# Patient Record
Sex: Female | Born: 1975 | Race: Black or African American | Hispanic: No | Marital: Married | Smoking: Never smoker
Health system: Southern US, Community
[De-identification: ages and names within clinical notes are randomized; demographics above are authoritative.]

---

## 2001-12-24 ENCOUNTER — Other Ambulatory Visit: Admission: RE | Admit: 2001-12-24 | Discharge: 2001-12-24 | Payer: Self-pay | Admitting: Gynecology

## 2002-12-25 ENCOUNTER — Ambulatory Visit (HOSPITAL_COMMUNITY): Admission: RE | Admit: 2002-12-25 | Discharge: 2002-12-25 | Payer: Self-pay | Admitting: Internal Medicine

## 2002-12-25 ENCOUNTER — Encounter: Payer: Self-pay | Admitting: Internal Medicine

## 2003-01-09 ENCOUNTER — Encounter: Admission: RE | Admit: 2003-01-09 | Discharge: 2003-02-04 | Payer: Self-pay | Admitting: Internal Medicine

## 2003-01-14 ENCOUNTER — Other Ambulatory Visit: Admission: RE | Admit: 2003-01-14 | Discharge: 2003-01-14 | Payer: Self-pay | Admitting: Gynecology

## 2003-01-30 ENCOUNTER — Encounter: Payer: Self-pay | Admitting: Internal Medicine

## 2004-01-27 ENCOUNTER — Other Ambulatory Visit: Admission: RE | Admit: 2004-01-27 | Discharge: 2004-01-27 | Payer: Self-pay | Admitting: Obstetrics & Gynecology

## 2004-06-09 ENCOUNTER — Emergency Department (HOSPITAL_COMMUNITY): Admission: EM | Admit: 2004-06-09 | Discharge: 2004-06-09 | Payer: Self-pay | Admitting: Emergency Medicine

## 2004-06-12 ENCOUNTER — Emergency Department (HOSPITAL_COMMUNITY): Admission: EM | Admit: 2004-06-12 | Discharge: 2004-06-12 | Payer: Self-pay | Admitting: Emergency Medicine

## 2005-02-02 ENCOUNTER — Emergency Department: Payer: Self-pay | Admitting: Unknown Physician Specialty

## 2005-02-07 ENCOUNTER — Ambulatory Visit: Payer: Self-pay | Admitting: Internal Medicine

## 2005-06-16 ENCOUNTER — Other Ambulatory Visit: Admission: RE | Admit: 2005-06-16 | Discharge: 2005-06-16 | Payer: Self-pay | Admitting: Obstetrics and Gynecology

## 2005-10-06 ENCOUNTER — Ambulatory Visit: Payer: Self-pay | Admitting: Internal Medicine

## 2007-08-16 ENCOUNTER — Ambulatory Visit: Payer: Self-pay | Admitting: Internal Medicine

## 2007-08-16 LAB — CONVERTED CEMR LAB
ALT: 13 units/L (ref 0–35)
AST: 24 units/L (ref 0–37)
Albumin: 4.2 g/dL (ref 3.5–5.2)
Alkaline Phosphatase: 50 units/L (ref 39–117)
BUN: 8 mg/dL (ref 6–23)
Basophils Absolute: 0 10*3/uL (ref 0.0–0.1)
Basophils Relative: 0.4 % (ref 0.0–1.0)
Bilirubin Urine: NEGATIVE
Bilirubin, Direct: 0.1 mg/dL (ref 0.0–0.3)
CO2: 27 meq/L (ref 19–32)
Calcium: 9.5 mg/dL (ref 8.4–10.5)
Chloride: 106 meq/L (ref 96–112)
Creatinine, Ser: 0.9 mg/dL (ref 0.4–1.2)
Crystals: NEGATIVE
Eosinophils Absolute: 0.1 10*3/uL (ref 0.0–0.6)
Eosinophils Relative: 1.4 % (ref 0.0–5.0)
GFR calc Af Amer: 94 mL/min
GFR calc non Af Amer: 78 mL/min
Glucose, Bld: 106 mg/dL — ABNORMAL HIGH (ref 70–99)
HCT: 37.2 % (ref 36.0–46.0)
Hemoglobin, Urine: NEGATIVE
Hemoglobin: 12.8 g/dL (ref 12.0–15.0)
Ketones, ur: NEGATIVE mg/dL
Lymphocytes Relative: 32.9 % (ref 12.0–46.0)
MCHC: 34.5 g/dL (ref 30.0–36.0)
MCV: 92.3 fL (ref 78.0–100.0)
Monocytes Absolute: 0.8 10*3/uL — ABNORMAL HIGH (ref 0.2–0.7)
Monocytes Relative: 8.9 % (ref 3.0–11.0)
Mucus, UA: NEGATIVE
Neutro Abs: 5.1 10*3/uL (ref 1.4–7.7)
Neutrophils Relative %: 56.4 % (ref 43.0–77.0)
Nitrite: NEGATIVE
Platelets: 192 10*3/uL (ref 150–400)
Potassium: 3.7 meq/L (ref 3.5–5.1)
RBC: 4.03 M/uL (ref 3.87–5.11)
RDW: 12.4 % (ref 11.5–14.6)
Sodium: 138 meq/L (ref 135–145)
Specific Gravity, Urine: 1.025 (ref 1.000–1.03)
TSH: 1.11 microintl units/mL (ref 0.35–5.50)
Total Bilirubin: 1 mg/dL (ref 0.3–1.2)
Total Protein, Urine: NEGATIVE mg/dL
Total Protein: 6.9 g/dL (ref 6.0–8.3)
Urine Glucose: NEGATIVE mg/dL
Urobilinogen, UA: 0.2 (ref 0.0–1.0)
WBC: 8.9 10*3/uL (ref 4.5–10.5)
pH: 6 (ref 5.0–8.0)

## 2007-10-03 ENCOUNTER — Encounter: Payer: Self-pay | Admitting: Internal Medicine

## 2007-10-03 ENCOUNTER — Ambulatory Visit: Payer: Self-pay | Admitting: Internal Medicine

## 2007-10-03 DIAGNOSIS — J309 Allergic rhinitis, unspecified: Secondary | ICD-10-CM | POA: Insufficient documentation

## 2007-10-03 DIAGNOSIS — M12849 Other specific arthropathies, not elsewhere classified, unspecified hand: Secondary | ICD-10-CM

## 2007-10-03 DIAGNOSIS — K219 Gastro-esophageal reflux disease without esophagitis: Secondary | ICD-10-CM

## 2008-05-07 ENCOUNTER — Ambulatory Visit: Payer: Self-pay | Admitting: Endocrinology

## 2008-05-07 ENCOUNTER — Encounter: Payer: Self-pay | Admitting: Endocrinology

## 2008-05-07 DIAGNOSIS — M25569 Pain in unspecified knee: Secondary | ICD-10-CM

## 2008-05-08 LAB — CONVERTED CEMR LAB: Uric Acid, Serum: 3.2 mg/dL (ref 2.4–7.0)

## 2008-06-16 ENCOUNTER — Ambulatory Visit: Payer: Self-pay | Admitting: Internal Medicine

## 2008-06-16 ENCOUNTER — Telehealth: Payer: Self-pay | Admitting: Internal Medicine

## 2008-06-16 LAB — CONVERTED CEMR LAB: hCG, Beta Chain, Quant, S: <0.5 milliintl units/mL

## 2008-06-17 ENCOUNTER — Telehealth: Payer: Self-pay | Admitting: Internal Medicine

## 2008-07-07 ENCOUNTER — Telehealth: Payer: Self-pay | Admitting: Internal Medicine

## 2008-10-27 IMAGING — CR DG KNEE 1-2V*R*
2 series · 2 of 2 positions shown · non-contrast
Comparison: None

CLINICAL DATA: Anterior knee pain for a month, no injury

RIGHT KNEE - 1-2 VIEW

[view not recorded (1 of 2)]
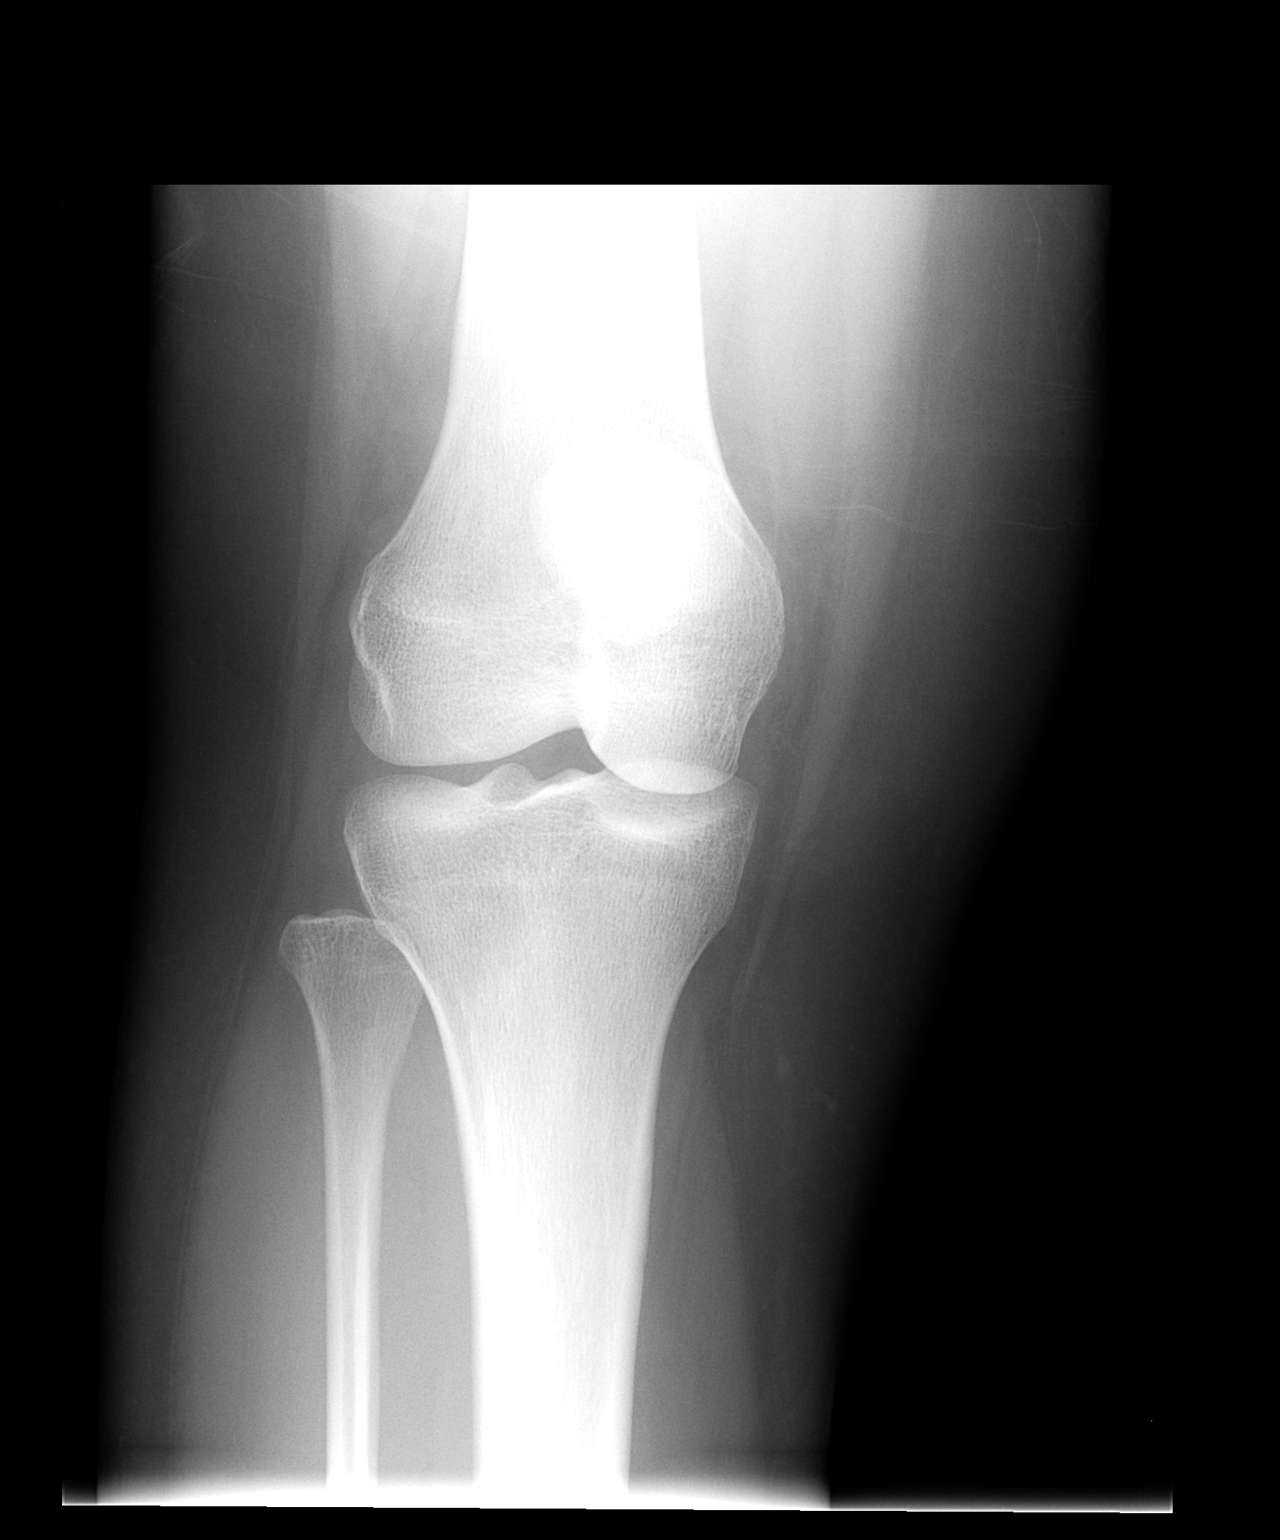

[view not recorded (2 of 2)]
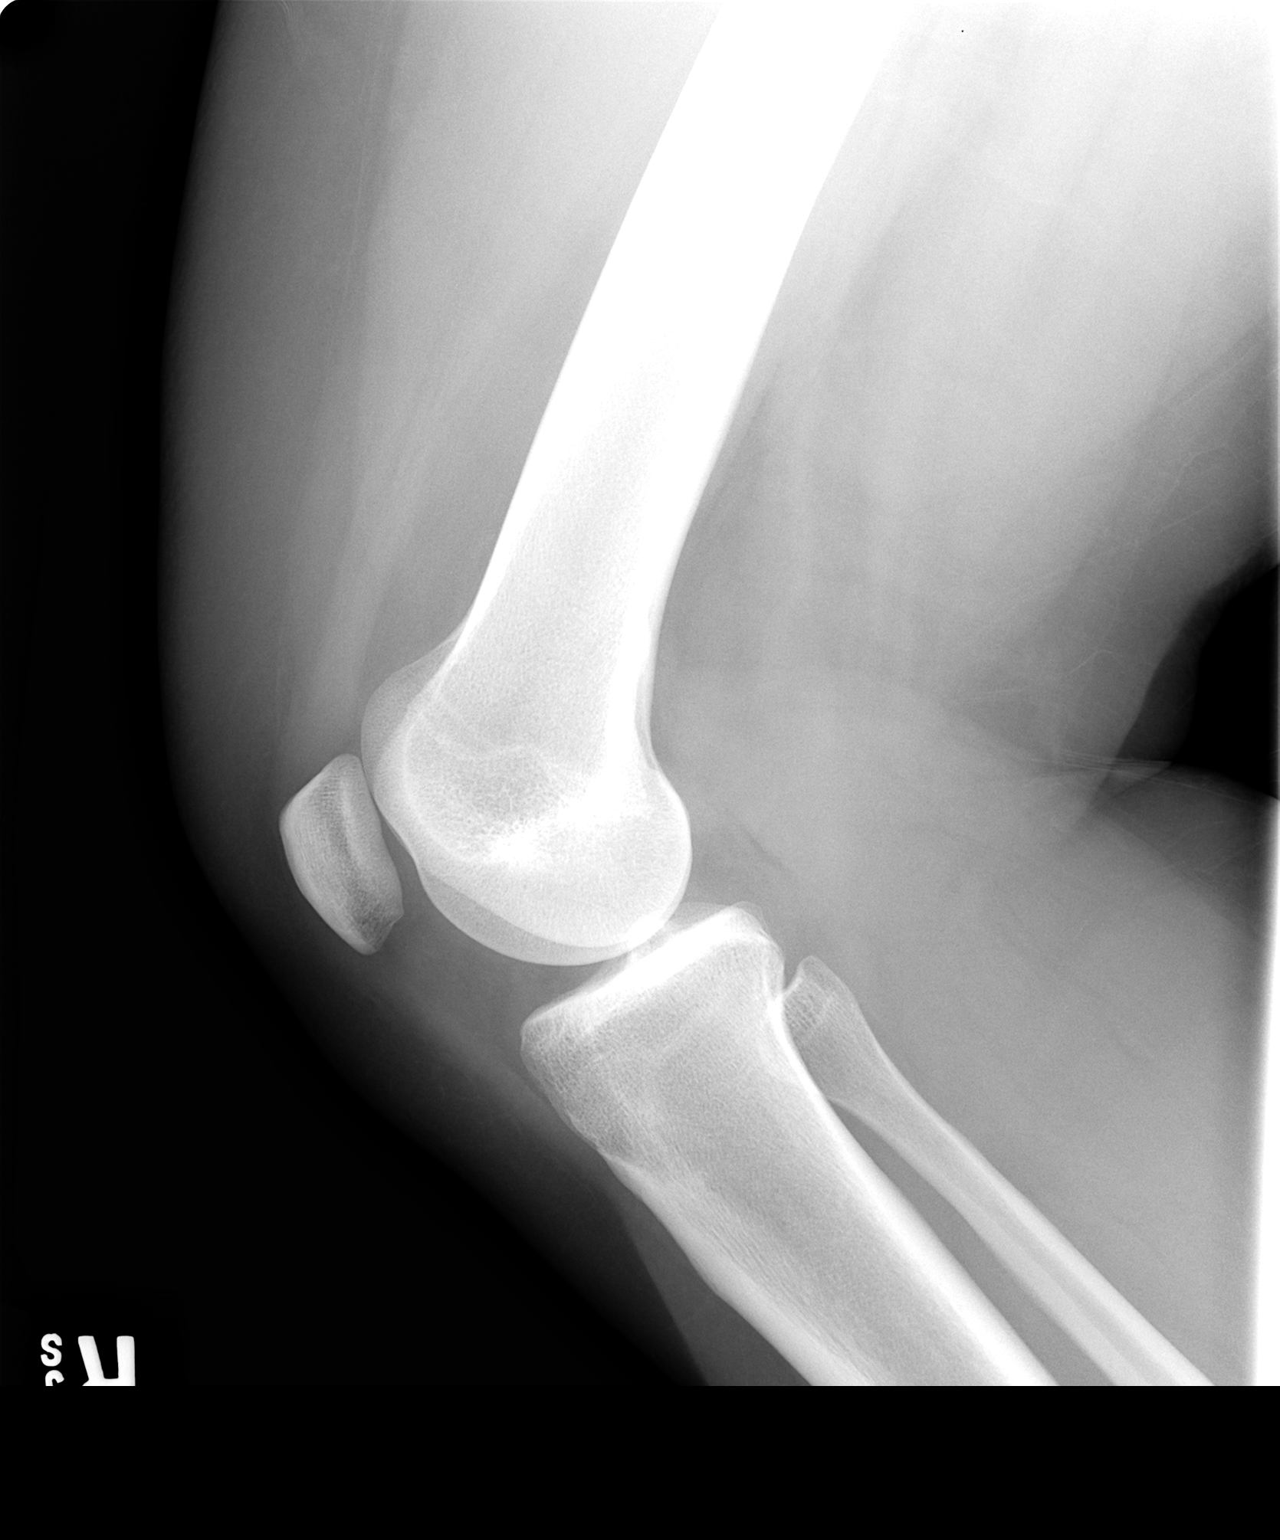

[2 of 2 positions shown; findings below may reference images not displayed]

FINDINGS: The joint spaces appear normal.  No effusion is seen.  No
fracture is noted.
IMPRESSION: Negative

## 2009-11-26 ENCOUNTER — Encounter: Payer: Self-pay | Admitting: Endocrinology

## 2009-11-27 ENCOUNTER — Ambulatory Visit: Payer: Self-pay | Admitting: Endocrinology

## 2009-11-27 DIAGNOSIS — R109 Unspecified abdominal pain: Secondary | ICD-10-CM | POA: Insufficient documentation

## 2010-01-12 ENCOUNTER — Encounter: Payer: Self-pay | Admitting: Internal Medicine

## 2010-01-14 ENCOUNTER — Encounter: Payer: Self-pay | Admitting: Internal Medicine

## 2010-01-15 ENCOUNTER — Ambulatory Visit (HOSPITAL_BASED_OUTPATIENT_CLINIC_OR_DEPARTMENT_OTHER): Admission: RE | Admit: 2010-01-15 | Discharge: 2010-01-15 | Payer: Self-pay | Admitting: Urology

## 2010-03-15 ENCOUNTER — Telehealth (INDEPENDENT_AMBULATORY_CARE_PROVIDER_SITE_OTHER): Payer: Self-pay | Admitting: *Deleted

## 2010-07-07 IMAGING — CR DG ABDOMEN 1V
1 series · 1 of 1 positions shown · non-contrast
Comparison: None

CLINICAL DATA: Right ureteral calculus, pre lithotripsy

ABDOMEN - 1 VIEW

[t abdomen supine]
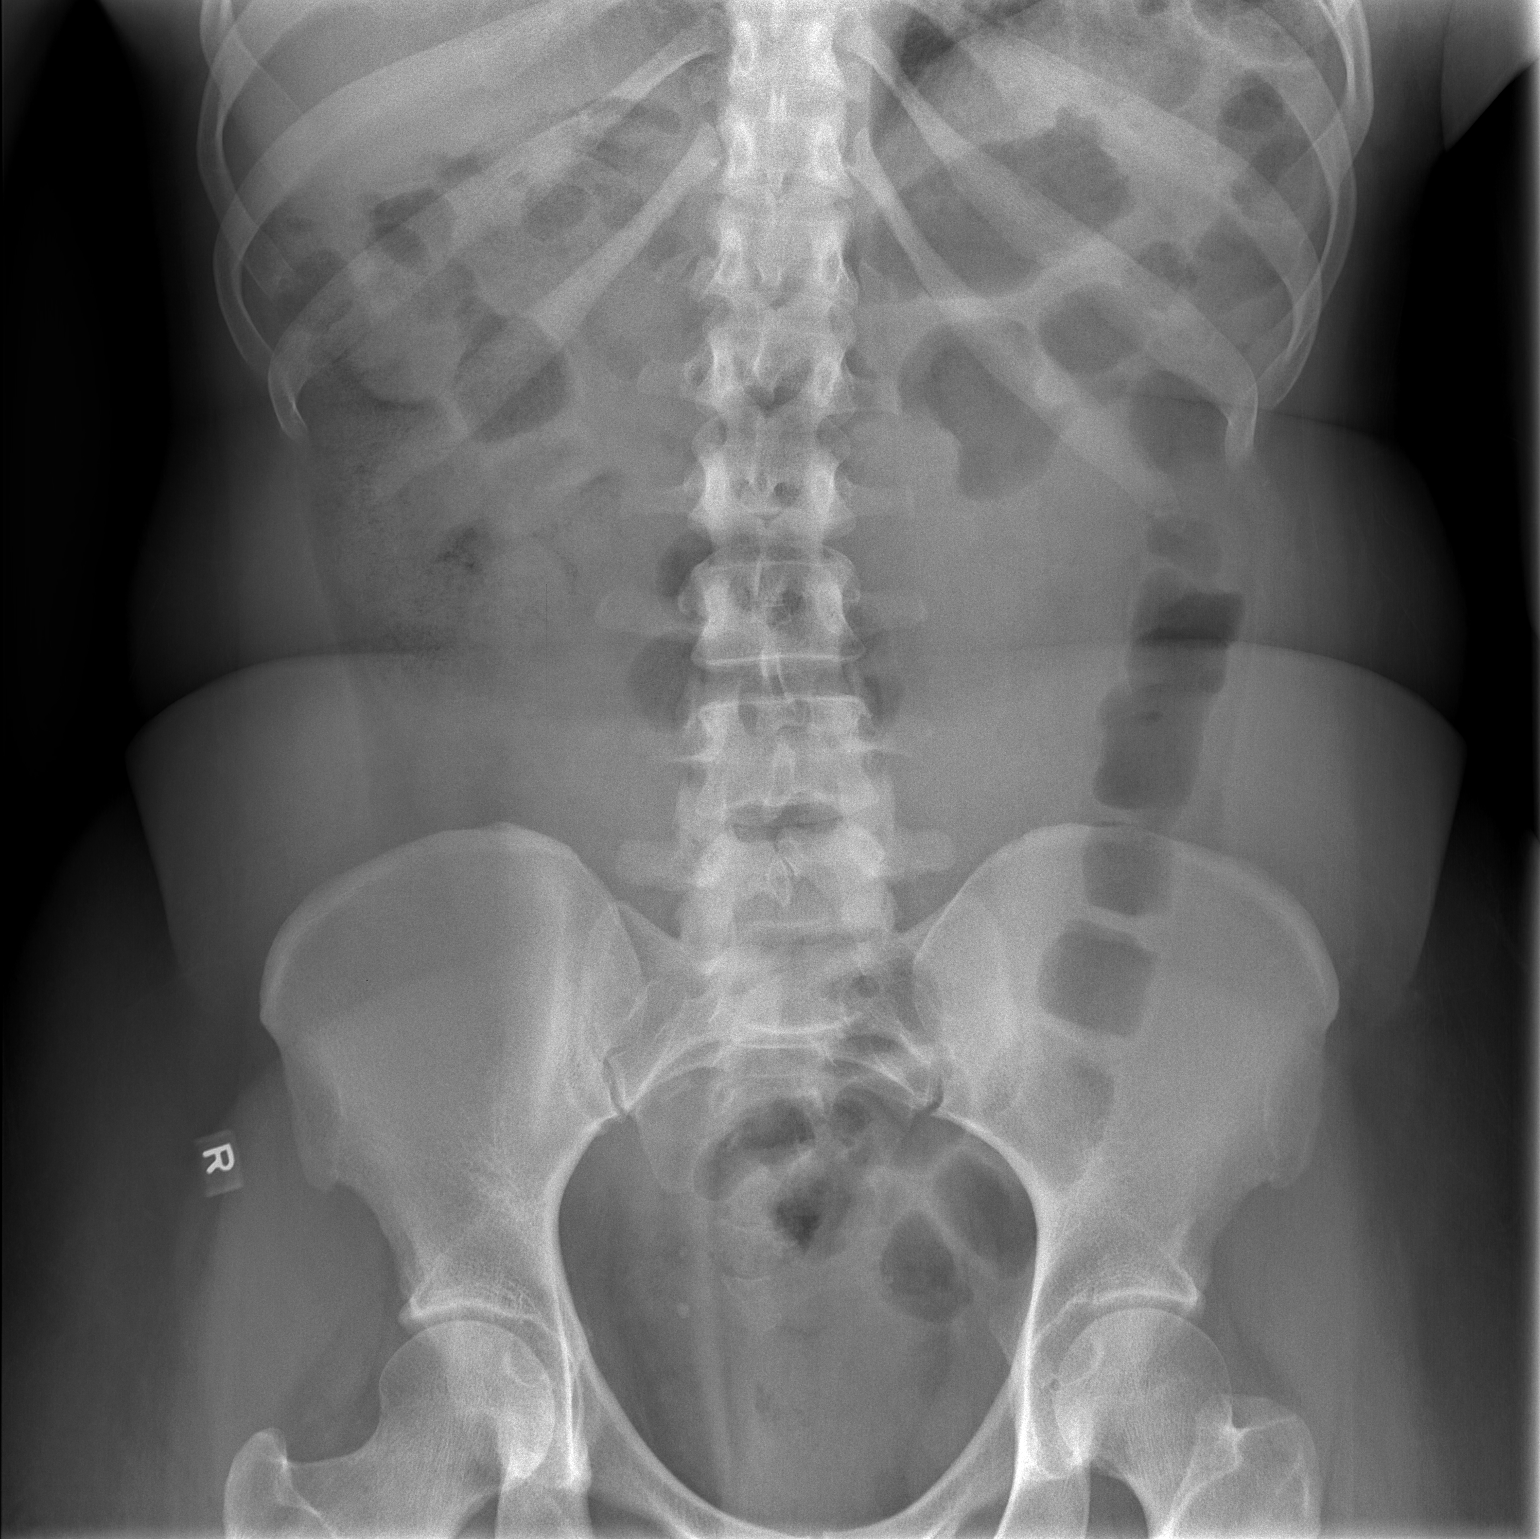

[1 of 1 positions shown; findings below may reference images not displayed]

FINDINGS: Spina bifida occulta of L5.
Small calcifications in right pelvis, questionably phleboliths
though calculi not completely excluded.
No renal calcification identified.
Bone mineralization normal.
Bowel gas pattern normal.
IMPRESSION: Two rounded calcifications in right pelvis, could represent small
phleboliths but calculi not completely excluded, recommend
correlation with any prior outside imaging the patient may have.

## 2010-12-15 ENCOUNTER — Emergency Department (HOSPITAL_COMMUNITY)
Admission: EM | Admit: 2010-12-15 | Discharge: 2010-12-15 | Payer: Self-pay | Source: Home / Self Care | Admitting: Family Medicine

## 2011-01-11 NOTE — Letter (Signed)
Summary: [facility]  [facility]   Imported By: Sherian Rein 01/19/2010 15:15:35  _____________________________________________________________________  External Attachment:    Type:   Image     Comment:   External Document

## 2011-01-11 NOTE — Progress Notes (Signed)
  Phone Note Other Incoming   Request: Send information Summary of Call: Request received from [facility] forwarded to [employer].

## 2011-01-11 NOTE — Letter (Signed)
Summary: [facility]  [facility]   Imported By: Sherian Rein 01/19/2010 15:16:55  _____________________________________________________________________  External Attachment:    Type:   Image     Comment:   External Document

## 2011-03-02 LAB — POCT PREGNANCY, URINE: Preg Test, Ur: NEGATIVE

## 2011-03-02 LAB — POCT HEMOGLOBIN-HEMACUE: Hemoglobin: 12.4 g/dL (ref 12.0–15.0)

## 2021-12-19 ENCOUNTER — Telehealth: Payer: Medicaid Other | Admitting: Emergency Medicine

## 2021-12-19 DIAGNOSIS — J069 Acute upper respiratory infection, unspecified: Secondary | ICD-10-CM

## 2021-12-19 DIAGNOSIS — J029 Acute pharyngitis, unspecified: Secondary | ICD-10-CM | POA: Diagnosis not present

## 2021-12-19 MED ORDER — CHLORHEXIDINE GLUCONATE 0.12 % MT SOLN: 15.0000 mL | Freq: Two times a day (BID) | OROMUCOSAL | 0 refills | Status: AC

## 2021-12-19 NOTE — Patient Instructions (Signed)
°  Sarahmarie Cordone, thank you for joining [place], PA-C for today's virtual visit.  While this provider is not your primary care provider (PCP), if your PCP is located in our provider database this encounter information will be shared with them immediately following your visit.  Consent: (Patient) Jacqueline Walters provided verbal consent for this virtual visit at the beginning of the encounter.  Current Medications:  Current Outpatient Medications:    chlorhexidine (PERIDEX) 0.12 % solution, Use as directed 15 mLs in the mouth or throat 2 (two) times daily., Disp: 473 mL, Rfl: 0   Medications ordered in this encounter:  Meds ordered this encounter  Medications   chlorhexidine (PERIDEX) 0.12 % solution    Sig: Use as directed 15 mLs in the mouth or throat 2 (two) times daily.    Dispense:  473 mL    Refill:  0    Order Specific Question:   Supervising Provider    Answer:   Hyacinth Meeker, BRIAN [[id number]]     *If you need refills on other medications prior to your next appointment, please contact your pharmacy*  Follow-Up: Call back or seek an in-person evaluation if the symptoms worsen or if the condition fails to improve as anticipated.  Other Instructions Get rest, and drink fluids Prescribed peridex use as directed for sore throat Use throat lozenges, hot tea, honey, and avoid second-hand smoke  Follow up with PCP as needed Follow up in person at urgent care or the emergency room if you have fever, sinus pain/ pressure, worsening cough, chest pain, shortness of breath, etc...   If you have been instructed to have an in-person evaluation today at a local Urgent Care facility, please use the link below. It will take you to a list of all of our available [facility], including address, phone number and hours of operation. Please do not delay care.  [facility]  If you or a family member do not have a primary care provider, use the link below to schedule a visit and  establish care. When you choose a [facility] primary care physician or advanced practice provider, you gain a long-term partner in health. Find a Primary Care Provider  Learn more about [facility]'s in-office and virtual care options: [facility] - Get Care Now

## 2021-12-19 NOTE — Progress Notes (Signed)
Virtual Visit Consent   Jacqueline Walters, you are scheduled for a virtual visit with a [facility] provider today.     Just as with appointments in the office, your consent must be obtained to participate.  Your consent will be active for this visit and any virtual visit you may have with one of our providers in the next 365 days.     If you have a MyChart account, a copy of this consent can be sent to you electronically.  All virtual visits are billed to your insurance company just like a traditional visit in the office.    As this is a virtual visit, video technology does not allow for your provider to perform a traditional examination.  This may limit your provider's ability to fully assess your condition.  If your provider identifies any concerns that need to be evaluated in person or the need to arrange testing (such as labs, EKG, etc.), we will make arrangements to do so.     Although advances in technology are sophisticated, we cannot ensure that it will always work on either your end or our end.  If the connection with a video visit is poor, the visit may have to be switched to a telephone visit.  With either a video or telephone visit, we are not always able to ensure that we have a secure connection.     I need to obtain your verbal consent now.   Are you willing to proceed with your visit today? yes   [name] has provided verbal consent on 12/19/2021 for a virtual visit (video or telephone).   [name], [place]   Date: 12/19/2021 1:48 PM   Virtual Visit via Video Note   I, [name], connected with  Jacqueline Walters  ([id number], 20-Sep-1976) on 12/19/21 at  1:30 PM EST by a video-enabled telemedicine application and verified that I am speaking with the correct person using two identifiers.  Location: Patient: Virtual Visit Location Patient: Home Provider: Virtual Visit Location Provider: Home Office   I discussed the limitations of evaluation and management by telemedicine and the  availability of in person appointments. The patient expressed understanding and agreed to proceed.    History of Present Illness: Jacqueline Walters is a 46 y.o. who identifies as a female who was assigned female at birth, and is being seen today for rhinorrhea, sore throat, and productive cough x 5 days.  Denies sick exposure to COVID, flu or strep.  Has tried OTC medications without relief.  Symptoms are made worse with at night.  Reports previous symptoms in the past with sinus infection.   Denies fever, chills, fatigue, sinus pain, SOB, wheezing, chest pain, nausea, changes in bowel or bladder habits.    ROS: As per HPI.  All other pertinent ROS negative.     HPI: HPI  Problems: There are no problems to display for this patient.   Allergies: patient has allergies listed in "merge" chart - these are accurate Medications:  Current Outpatient Medications:    chlorhexidine (PERIDEX) 0.12 % solution, Use as directed 15 mLs in the mouth or throat 2 (two) times daily., Disp: 473 mL, Rfl: 0  Observations/Objective: Patient is well-developed, well-nourished in no acute distress.  Resting comfortably at home.  Head is normocephalic, atraumatic.  No labored breathing. Speaking in full sentences and tolerating own secretions Speech is clear and coherent with logical content.  Patient is alert and oriented at baseline.    Assessment and Plan: 1. Viral  URI with cough  2. Sore throat   Get rest, and drink fluids Prescribed peridex use as directed for sore throat Use throat lozenges, hot tea, honey, and avoid second-hand smoke  Follow up with PCP as needed Follow up in person at urgent care or the emergency room if you have fever, sinus pain/ pressure, worsening cough, chest pain, shortness of breath, etc...  Follow Up Instructions: I discussed the assessment and treatment plan with the patient. The patient was provided an opportunity to ask questions and all were answered. The patient agreed with  the plan and demonstrated an understanding of the instructions.  A copy of instructions were sent to the patient via MyChart unless otherwise noted below.    The patient was advised to call back or seek an in-person evaluation if the symptoms worsen or if the condition fails to improve as anticipated.  Time:  I spent 15-20 minutes with the patient via telehealth technology discussing the above problems/concerns.    [name], PA-C

## 2022-10-04 ENCOUNTER — Ambulatory Visit
Admission: EM | Admit: 2022-10-04 | Discharge: 2022-10-04 | Disposition: A | Discharge status: Home / Self Care | Payer: Medicaid Other

## 2022-10-04 ENCOUNTER — Other Ambulatory Visit: Payer: Self-pay

## 2022-10-04 DIAGNOSIS — J069 Acute upper respiratory infection, unspecified: Secondary | ICD-10-CM | POA: Diagnosis not present

## 2022-10-04 DIAGNOSIS — R051 Acute cough: Secondary | ICD-10-CM

## 2022-10-04 DIAGNOSIS — Z1152 Encounter for screening for COVID-19: Secondary | ICD-10-CM

## 2022-10-04 DIAGNOSIS — J029 Acute pharyngitis, unspecified: Secondary | ICD-10-CM

## 2022-10-04 LAB — POCT RAPID STREP A (OFFICE): Rapid Strep A Screen: NEGATIVE

## 2022-10-04 MED ORDER — BENZONATATE 100 MG PO CAPS: 100.0000 mg | ORAL_CAPSULE | Freq: Three times a day (TID) | ORAL | 0 refills | Status: AC

## 2022-10-04 NOTE — ED Provider Notes (Signed)
[facility]    CSN: [id number] Arrival date & time: 10/04/22  0810      History   Chief Complaint Chief Complaint  Patient presents with   Fever    HPI Jacqueline Walters is a 46 y.o. female.   46 year old female presents with cough, congestion and sore throat.  Patient indicates for the past 4 days she has been having sore throat, painful swallowing, which has been progressive.  She also indicates she is having some upper respiratory congestion and intermittent cough.  She indicates that the production is clear.  She relates she has not have any fever, chills, body aches or pain.  He does relate that she has taken some OTC cough preparations however this is only minimally controlling her symptoms.  Patient indicates she has been around her spouse to have similar symptoms.  She denies any wheezing, shortness of breath, nausea or vomiting.   Fever Associated symptoms: cough, rhinorrhea and sore throat     No past medical history on file.  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  OB History   No obstetric history on file.      Home Medications    Prior to Admission medications   Medication Sig Start Date End Date Taking? Authorizing Provider  albuterol (VENTOLIN HFA) 108 (90 Base) MCG/ACT inhaler Inhale into the lungs. 10/21/16  Yes [provider]  benzonatate (TESSALON) 100 MG capsule Take 1 capsule (100 mg total) by mouth every 8 (eight) hours. 10/04/22  Yes Ellsworth Lennox, PA-C  Cholecalciferol (VITAMIN D3) 1.25 MG (50000 UT) CAPS Take 1,250 mg by mouth daily. 06/15/22  Yes [provider]  Dextromethorphan-guaiFENesin 10-100 MG/5ML liquid Take by mouth. 07/28/22  Yes [provider]  aspirin-acetaminophen-caffeine (EXCEDRIN MIGRAINE) [phone]  MG tablet Take by mouth.    [provider]  chlorhexidine (PERIDEX) 0.12 % solution Use as directed 15 mLs in the mouth or throat 2 (two) times daily. 12/19/21   [place], [place], PA-C  etonogestrel (NEXPLANON) 68 MG IMPL implant Inject into the skin.    [provider]  fluticasone (FLONASE) 50 MCG/ACT nasal spray Place into both nostrils. 07/27/22   [provider]  norethindrone (AYGESTIN) 5 MG tablet Take 5 mg by mouth daily. 09/06/22   [provider]    Family History No family history on file.  Social History Social History   Tobacco Use   Smoking status: Never   Smokeless tobacco: Never  Vaping Use   Vaping Use: Never used  Substance Use Topics   Alcohol use: Yes    Comment: occ   Drug use: Not Currently     Allergies   Codeine, Cyclobenzaprine hcl, Hydrocodone-acetaminophen, and Ofloxacin   Review of Systems Review of Systems  Constitutional:  Positive for fever.  HENT:  Positive for postnasal drip, rhinorrhea and sore throat.   Respiratory:  Positive for cough.      Physical Exam Triage Vital Signs ED Triage Vitals  Enc Vitals Group     BP 10/04/22 0859 (!) 165/89     Pulse Rate 10/04/22 0859 68     Resp 10/04/22 0859 19     Temp 10/04/22 0859 98 F (36.7 C)     Temp src --      SpO2  10/04/22 0859 98 %     Weight --      Height --      Head Circumference --      Peak Flow --      Pain Score 10/04/22 0856 0     Pain Loc --      Pain Edu? --      Excl. in GC? --    No data found.  Updated Vital Signs BP (!) 165/89   Pulse 68   Temp 98 F (36.7 C)   Resp 19   LMP  (Approximate)   SpO2 98%   Visual Acuity Right Eye Distance:   Left Eye Distance:   Bilateral Distance:    Right Eye Near:   Left Eye Near:    Bilateral Near:     Physical Exam Constitutional:      Appearance: Normal appearance.  HENT:     Right Ear: Tympanic membrane and ear canal normal.     Left Ear: Tympanic membrane and ear canal  normal.     Mouth/Throat:     Mouth: Mucous membranes are moist.     Pharynx: Uvula midline. Oropharyngeal exudate and posterior oropharyngeal erythema present.  Cardiovascular:     Rate and Rhythm: Normal rate and regular rhythm.     Heart sounds: Normal heart sounds.  Pulmonary:     Effort: Pulmonary effort is normal.     Breath sounds: Normal breath sounds and air entry. No wheezing, rhonchi or rales.  Lymphadenopathy:     Cervical: No cervical adenopathy.  Neurological:     Mental Status: She is alert.      UC Treatments / Results  Labs (all labs ordered are listed, but only abnormal results are displayed) Labs Reviewed  RESP PANEL BY RT-PCR (FLU A&B, COVID) ARPGX2  CULTURE, GROUP A STREP [facility])  POCT RAPID STREP A (OFFICE)    EKG   Radiology No results found.  Procedures Procedures (including critical care time)  Medications Ordered in UC Medications - No data to display  Initial Impression / Assessment and Plan / UC Course  I have reviewed the triage vital signs and the nursing notes.  Pertinent labs & imaging results that were available during my care of the patient were reviewed by me and considered in my medical decision making (see chart for details).    Plan: 1.  The upper respiratory infection will be treated with the following: A.  Advised to use OTC cold medications to control control the congestion. 2.  The acute cough will be treated with the following: A.  Tessalon Perles 100 mg every 8 hours as needed for cough. 3.  Screen for COVID-19 will be treated with the following: A.  Treatment will be adjusted depending on COVID and flu test results. 4.  The pharyngitis will be treated with the following: A.  Throat culture is pending. B.  Advised use Tylenol or ibuprofen along with salt water gargles and lozenges to help soothe the throat. 5.  Advised to follow-up with PCP or return to urgent care if symptoms fail to improve. Final Clinical  Impressions(s) / UC Diagnoses   Final diagnoses:  Acute upper respiratory infection  Acute pharyngitis, unspecified etiology  Acute cough  Encounter for screening for COVID-19     Discharge Instructions      COVID and flu test will be completed in 48 hours.  If you do not get a call from this office that indicates the test is negative.  Log onto MyChart to view the test results when they post in 48 hours. Has used [employer] every 8 hours on a regular basis to help control cough. Advised to continue to use Tylenol or ibuprofen along with salt water gargles and lozenges to help control sore throat. Advised to follow-up with PCP or return to urgent care if symptoms fail to improve.     ED Prescriptions     Medication Sig Dispense Auth. Provider   benzonatate (TESSALON) 100 MG capsule Take 1 capsule (100 mg total) by mouth every 8 (eight) hours. 21 capsule Nyoka Lint, PA-C      PDMP not reviewed this encounter.   Nyoka Lint, PA-C 10/04/22 [phone]

## 2022-10-04 NOTE — ED Triage Notes (Signed)
Pt presents to uc with co of fevers, cough, congestion sneezing since Friday. Pt reports otc cold and cough medication with minimal relief.

## 2022-10-04 NOTE — Discharge Instructions (Addendum)
COVID and flu test will be completed in 48 hours.  If you do not get a call from this office that indicates the test is negative.  Log onto MyChart to view the test results when they post in 48 hours. Has used [employer] every 8 hours on a regular basis to help control cough. Advised to continue to use Tylenol or ibuprofen along with salt water gargles and lozenges to help control sore throat. Advised to follow-up with PCP or return to urgent care if symptoms fail to improve.

## 2022-10-05 ENCOUNTER — Ambulatory Visit
Admission: EM | Admit: 2022-10-05 | Discharge: 2022-10-05 | Disposition: A | Discharge status: Home / Self Care | Payer: Medicaid Other | Attending: Internal Medicine | Admitting: Internal Medicine

## 2022-10-05 DIAGNOSIS — R030 Elevated blood-pressure reading, without diagnosis of hypertension: Secondary | ICD-10-CM | POA: Diagnosis not present

## 2022-10-05 DIAGNOSIS — J069 Acute upper respiratory infection, unspecified: Secondary | ICD-10-CM

## 2022-10-05 MED ORDER — PREDNISONE 20 MG PO TABS: 40.0000 mg | ORAL_TABLET | Freq: Every day | ORAL | 0 refills | Status: AC

## 2022-10-05 NOTE — ED Provider Notes (Signed)
[facility]    CSN: [id number] Arrival date & time: 10/05/22  1349      History   Chief Complaint Chief Complaint  Patient presents with   Cough    HPI Jacqueline Walters is a 46 y.o. female.   Patient presents with productive cough, sore throat, nasal congestion that started about 5 days ago.  Denies any known sick contacts or fevers at home.  Patient was seen yesterday at urgent care and prescribed Tessalon Perles.  She reports that those typically work well for her but have not provided much relief this time.  She reports that symptoms have been very persistent and appear to be worsening.  Denies chest pain, shortness of breath, ear pain, nausea, vomiting, diarrhea, abdominal pain.  Patient reports that she has also taken several over-the-counter cold and flu medications with minimal improvement in symptoms.  Denies any formal diagnosis of asthma or COPD.  Patient also has elevated blood pressure reading with recheck being similar.  Denies chest pain, shortness of breath, headache, dizziness, blurred vision, nausea, vomiting.  Patient denies that she takes any daily blood pressure medications and denies history of hypertension.   Cough   History reviewed. No pertinent past medical history.  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 No pertinent surgical history.  OB History   No obstetric history on file.      Home Medications    Prior to Admission medications   Medication Sig Start Date End Date Taking? Authorizing Provider  predniSONE (DELTASONE) 20 MG tablet Take 2 tablets (40 mg total) by mouth daily for 5 days. 10/05/22 10/10/22 Yes Zaheer Wageman, Acie Fredrickson, FNP  albuterol (VENTOLIN HFA) 108 (90 Base) MCG/ACT inhaler Inhale into the lungs. 10/21/16   [provider]   aspirin-acetaminophen-caffeine (EXCEDRIN MIGRAINE) [phone] MG tablet Take by mouth.    [provider]  benzonatate (TESSALON) 100 MG capsule Take 1 capsule (100 mg total) by mouth every 8 (eight) hours. 10/04/22   Ellsworth Lennox, PA-C  chlorhexidine (PERIDEX) 0.12 % solution Use as directed 15 mLs in the mouth or throat 2 (two) times daily. 12/19/21   Wurst, [place], PA-C  Cholecalciferol (VITAMIN D3) 1.25 MG (50000 UT) CAPS Take 1,250 mg by mouth daily. 06/15/22   [provider]  Dextromethorphan-guaiFENesin 10-100 MG/5ML liquid Take by mouth. 07/28/22   [provider]  etonogestrel (NEXPLANON) 68 MG IMPL implant Inject into the skin.    [provider]  fluticasone (FLONASE) 50 MCG/ACT nasal spray Place into both nostrils. 07/27/22   [provider]  norethindrone (AYGESTIN) 5 MG tablet Take 5 mg by mouth daily. 09/06/22   [provider]    Family History History reviewed. No pertinent family history.  Social History Social History   Tobacco Use   Smoking status: Never   Smokeless tobacco: Never  Vaping Use   Vaping Use: Never used  Substance Use Topics   Alcohol use: Yes    Comment: occ   Drug use: Not Currently     Allergies   Codeine, Cyclobenzaprine hcl, Hydrocodone-acetaminophen, and Ofloxacin   Review of Systems Review of Systems Per HPI  Physical Exam Triage Vital Signs ED Triage Vitals  Enc Vitals Group     BP 10/05/22 1408 (!) 171/100     Pulse Rate  10/05/22 1408 74     Resp 10/05/22 1408 18     Temp 10/05/22 1408 98.6 F (37 C)     Temp Source 10/05/22 1408 Oral     SpO2 10/05/22 1408 97 %     Weight --      Height --      Head Circumference --      Peak Flow --      Pain Score 10/05/22 1409 6     Pain Loc --      Pain Edu? --      Excl. in GC? --    No data found.  Updated Vital Signs BP (!) 160/99   Pulse 74   Temp 98.6 F (37 C) (Oral)   Resp 18   LMP  (Approximate)   SpO2 97%    Visual Acuity Right Eye Distance:   Left Eye Distance:   Bilateral Distance:    Right Eye Near:   Left Eye Near:    Bilateral Near:     Physical Exam Constitutional:      General: She is not in acute distress.    Appearance: Normal appearance. She is not toxic-appearing or diaphoretic.  HENT:     Head: Normocephalic and atraumatic.     Right Ear: Tympanic membrane and ear canal normal.     Left Ear: Tympanic membrane and ear canal normal.     Nose: Congestion present.     Mouth/Throat:     Mouth: Mucous membranes are moist.     Pharynx: No posterior oropharyngeal erythema.  Eyes:     Extraocular Movements: Extraocular movements intact.     Conjunctiva/sclera: Conjunctivae normal.     Pupils: Pupils are equal, round, and reactive to light.  Cardiovascular:     Rate and Rhythm: Normal rate and regular rhythm.     Pulses: Normal pulses.     Heart sounds: Normal heart sounds.  Pulmonary:     Effort: Pulmonary effort is normal. No respiratory distress.     Breath sounds: Normal breath sounds. No stridor. No wheezing, rhonchi or rales.  Abdominal:     General: Abdomen is flat. Bowel sounds are normal.     Palpations: Abdomen is soft.  Musculoskeletal:        General: Normal range of motion.     Cervical back: Normal range of motion.  Skin:    General: Skin is warm and dry.  Neurological:     General: No focal deficit present.     Mental Status: She is alert and oriented to person, place, and time. Mental status is at baseline.     Cranial Nerves: Cranial nerves 2-12 are intact.     Sensory: Sensation is intact.     Motor: Motor function is intact.     Coordination: Coordination is intact.     Gait: Gait is intact.  Psychiatric:        Mood and Affect: Mood normal.        Behavior: Behavior normal.      UC Treatments / Results  Labs (all labs ordered are listed, but only abnormal results are displayed) Labs Reviewed - No data to display  EKG   Radiology No  results found.  Procedures Procedures (including critical care time)  Medications Ordered in UC Medications - No data to display  Initial Impression / Assessment and Plan / UC Course  I have reviewed the triage vital signs and the nursing notes.  Pertinent labs & imaging  results that were available during my care of the patient were reviewed by me and considered in my medical decision making (see chart for details).     Patient presents with symptoms likely from a viral upper respiratory infection. Differential includes bacterial pneumonia, sinusitis, allergic rhinitis, COVID-19, flu, RSV. Do not suspect underlying cardiopulmonary process. Symptoms seem unlikely related to ACS, CHF or COPD exacerbations, pneumonia, pneumothorax. Patient is nontoxic appearing and not in need of emergent medical intervention.  Rapid strep at previous urgent care visit yesterday was negative.  She has a throat culture and COVID test that are pending currently.  Recommended symptom control with over the counter medications.  I do think that patient would benefit from prednisone given persistent symptoms and symptoms being refractory to over-the-counter medications.  There is no sign of or any need for antibiotic therapy or secondary bacterial infection on exam at this time.  Reports she has taken prednisone before and tolerated well.  Patient has elevated blood pressure reading but no signs of hypertensive urgency on exam.  No signs of endorgan damage and neuro exam is normal.  Patient has no previous history of hypertension so she was advised to monitor very closely at home with home blood pressure cuff.  Suspect over-the-counter cold and flu medications are causing elevated blood pressure reading.  Low-dose and short course of prednisone should be safe.  She was given strict follow-up precautions and advised to follow-up with PCP if it remains elevated.  Return if symptoms fail to improve in 1-2 weeks or you  develop shortness of breath, chest pain, severe headache. Patient states understanding and is agreeable.  Discharged with PCP followup.  Final Clinical Impressions(s) / UC Diagnoses   Final diagnoses:  Viral upper respiratory tract infection with cough  Elevated blood pressure reading     Discharge Instructions      You have a viral upper respiratory infection which should run its course and self resolve with symptomatic treatment.  I have prescribed you prednisone which I think you would benefit from given persistent and worsening symptoms.  Your COVID test is still pending.  We will call if it is positive.  Please follow-up if symptoms persist or worsen.    ED Prescriptions     Medication Sig Dispense Auth. Provider   predniSONE (DELTASONE) 20 MG tablet Take 2 tablets (40 mg total) by mouth daily for 5 days. 10 tablet Teodora Medici, [place]      PDMP not reviewed this encounter.   Teodora Medici, [place] 10/05/22 [phone]

## 2022-10-05 NOTE — ED Triage Notes (Signed)
Pt c/o cough, sore throat, onset ~ last Friday   Recently seen by another provider and tested strep(-) and given tessalon pearls without relief.

## 2022-10-05 NOTE — Discharge Instructions (Signed)
You have a viral upper respiratory infection which should run its course and self resolve with symptomatic treatment.  I have prescribed you prednisone which I think you would benefit from given persistent and worsening symptoms.  Your COVID test is still pending.  We will call if it is positive.  Please follow-up if symptoms persist or worsen.

## 2022-10-31 LAB — CULTURE, GROUP A STREP

## 2022-11-04 LAB — SPECIMEN STATUS REPORT
